# Patient Record
Sex: Female | Born: 1935 | Race: White | Hispanic: No | Marital: Single
Health system: Southern US, Community
[De-identification: ages and names within clinical notes are randomized; demographics above are authoritative.]

---

## 2005-10-08 ENCOUNTER — Emergency Department: Payer: Self-pay | Admitting: Emergency Medicine

## 2008-05-01 ENCOUNTER — Emergency Department: Payer: Self-pay | Admitting: Emergency Medicine

## 2009-05-27 ENCOUNTER — Inpatient Hospital Stay: Payer: Self-pay | Admitting: Podiatry

## 2009-06-10 ENCOUNTER — Emergency Department: Payer: Self-pay

## 2010-09-20 ENCOUNTER — Emergency Department: Payer: Self-pay | Admitting: Emergency Medicine

## 2012-03-09 ENCOUNTER — Emergency Department: Payer: Self-pay | Admitting: Emergency Medicine

## 2012-03-09 LAB — URINALYSIS, COMPLETE
Bilirubin,UR: NEGATIVE
Glucose,UR: NEGATIVE mg/dL (ref 0–75)
Hyaline Cast: 1
Ketone: NEGATIVE
Nitrite: NEGATIVE
Ph: 5 (ref 4.5–8.0)
WBC UR: 5 /HPF (ref 0–5)

## 2012-04-15 ENCOUNTER — Emergency Department: Payer: Self-pay | Admitting: Emergency Medicine

## 2012-04-15 LAB — COMPREHENSIVE METABOLIC PANEL
Albumin: 3.3 g/dL — ABNORMAL LOW (ref 3.4–5.0)
Alkaline Phosphatase: 147 U/L — ABNORMAL HIGH (ref 50–136)
BUN: 24 mg/dL — ABNORMAL HIGH (ref 7–18)
Bilirubin,Total: 0.3 mg/dL (ref 0.2–1.0)
Calcium, Total: 10.2 mg/dL — ABNORMAL HIGH (ref 8.5–10.1)
Chloride: 97 mmol/L — ABNORMAL LOW (ref 98–107)
Co2: 29 mmol/L (ref 21–32)
Creatinine: 1.29 mg/dL (ref 0.60–1.30)
Osmolality: 272 (ref 275–301)
Potassium: 4.3 mmol/L (ref 3.5–5.1)
SGOT(AST): 27 U/L (ref 15–37)
SGPT (ALT): 16 U/L
Sodium: 133 mmol/L — ABNORMAL LOW (ref 136–145)

## 2012-04-15 LAB — CBC WITH DIFFERENTIAL/PLATELET
Eosinophil %: 1.5 %
HCT: 32.3 % — ABNORMAL LOW (ref 35.0–47.0)
HGB: 10.7 g/dL — ABNORMAL LOW (ref 12.0–16.0)
Lymphocyte #: 1.1 10*3/uL (ref 1.0–3.6)
Lymphocyte %: 24.8 %
Monocyte #: 0.6 x10 3/mm (ref 0.2–0.9)
Neutrophil %: 58.4 %
RBC: 3.21 10*6/uL — ABNORMAL LOW (ref 3.80–5.20)
WBC: 4.5 10*3/uL (ref 3.6–11.0)

## 2012-04-15 LAB — URINALYSIS, COMPLETE
Bilirubin,UR: NEGATIVE
Blood: NEGATIVE
Glucose,UR: NEGATIVE mg/dL (ref 0–75)
Ketone: NEGATIVE
Nitrite: NEGATIVE
Ph: 6 (ref 4.5–8.0)
Specific Gravity: 1.01 (ref 1.003–1.030)

## 2012-04-17 LAB — URINE CULTURE

## 2012-04-18 ENCOUNTER — Emergency Department: Payer: Self-pay | Admitting: Emergency Medicine

## 2012-04-18 LAB — COMPREHENSIVE METABOLIC PANEL
Albumin: 3.5 g/dL (ref 3.4–5.0)
BUN: 19 mg/dL — ABNORMAL HIGH (ref 7–18)
Calcium, Total: 10.2 mg/dL — ABNORMAL HIGH (ref 8.5–10.1)
Co2: 31 mmol/L (ref 21–32)
Creatinine: 1.38 mg/dL — ABNORMAL HIGH (ref 0.60–1.30)
EGFR (Non-African Amer.): 34 — ABNORMAL LOW
Glucose: 125 mg/dL — ABNORMAL HIGH (ref 65–99)
Osmolality: 274 (ref 275–301)
Potassium: 3.5 mmol/L (ref 3.5–5.1)
SGOT(AST): 32 U/L (ref 15–37)

## 2012-04-18 LAB — CBC
HCT: 34.7 % — ABNORMAL LOW (ref 35.0–47.0)
HGB: 11.5 g/dL — ABNORMAL LOW (ref 12.0–16.0)
MCV: 102 fL — ABNORMAL HIGH (ref 80–100)
Platelet: 192 10*3/uL (ref 150–440)

## 2012-04-18 LAB — URINALYSIS, COMPLETE
Bacteria: NONE SEEN
Bilirubin,UR: NEGATIVE
Blood: NEGATIVE
Glucose,UR: NEGATIVE mg/dL (ref 0–75)
Ketone: NEGATIVE
Nitrite: NEGATIVE
Ph: 6 (ref 4.5–8.0)
Protein: NEGATIVE
RBC,UR: <1 /HPF (ref 0–5)
Specific Gravity: 1.013 (ref 1.003–1.030)
Squamous Epithelial: <1
WBC UR: 15 /HPF (ref 0–5)

## 2012-04-18 LAB — LIPASE, BLOOD: Lipase: 806 U/L — ABNORMAL HIGH (ref 73–393)

## 2012-05-31 ENCOUNTER — Emergency Department: Payer: Self-pay | Admitting: Emergency Medicine

## 2012-05-31 LAB — URINALYSIS, COMPLETE
Bacteria: NONE SEEN
Glucose,UR: NEGATIVE mg/dL (ref 0–75)
Hyaline Cast: 4
Nitrite: NEGATIVE
Ph: 5 (ref 4.5–8.0)
Specific Gravity: 1.014 (ref 1.003–1.030)

## 2012-05-31 LAB — TROPONIN I: Troponin-I: <0.02 ng/mL

## 2012-05-31 LAB — COMPREHENSIVE METABOLIC PANEL
Albumin: 3.1 g/dL — ABNORMAL LOW (ref 3.4–5.0)
Alkaline Phosphatase: 99 U/L (ref 50–136)
Anion Gap: 6 — ABNORMAL LOW (ref 7–16)
BUN: 22 mg/dL — ABNORMAL HIGH (ref 7–18)
Calcium, Total: 10.2 mg/dL — ABNORMAL HIGH (ref 8.5–10.1)
Co2: 30 mmol/L (ref 21–32)
EGFR (Non-African Amer.): 38 — ABNORMAL LOW
Glucose: 167 mg/dL — ABNORMAL HIGH (ref 65–99)
Potassium: 3.9 mmol/L (ref 3.5–5.1)
SGOT(AST): 28 U/L (ref 15–37)
Sodium: 136 mmol/L (ref 136–145)
Total Protein: 7.1 g/dL (ref 6.4–8.2)

## 2012-05-31 LAB — CBC
HGB: 10.8 g/dL — ABNORMAL LOW (ref 12.0–16.0)
MCH: 32.4 pg (ref 26.0–34.0)
Platelet: 206 10*3/uL (ref 150–440)
RDW: 12.6 % (ref 11.5–14.5)
WBC: 4.6 10*3/uL (ref 3.6–11.0)

## 2012-05-31 LAB — LIPASE, BLOOD: Lipase: 205 U/L (ref 73–393)

## 2012-11-10 ENCOUNTER — Ambulatory Visit: Payer: Self-pay | Admitting: Hospice and Palliative Medicine

## 2012-12-05 ENCOUNTER — Emergency Department: Payer: Self-pay | Admitting: Emergency Medicine

## 2012-12-05 LAB — URINALYSIS, COMPLETE
Bacteria: NONE SEEN
Bilirubin,UR: NEGATIVE
Glucose,UR: NEGATIVE mg/dL (ref 0–75)
Ketone: NEGATIVE
Nitrite: NEGATIVE
Ph: 5 (ref 4.5–8.0)
RBC,UR: 11 /HPF (ref 0–5)
Squamous Epithelial: 3
WBC UR: 24 /HPF (ref 0–5)

## 2012-12-05 LAB — COMPREHENSIVE METABOLIC PANEL
Alkaline Phosphatase: 120 U/L (ref 50–136)
Bilirubin,Total: 0.6 mg/dL (ref 0.2–1.0)
Chloride: 98 mmol/L (ref 98–107)
Co2: 29 mmol/L (ref 21–32)
Creatinine: 1.41 mg/dL — ABNORMAL HIGH (ref 0.60–1.30)
EGFR (African American): 38 — ABNORMAL LOW
EGFR (Non-African Amer.): 33 — ABNORMAL LOW
SGOT(AST): 38 U/L — ABNORMAL HIGH (ref 15–37)
SGPT (ALT): 16 U/L (ref 12–78)

## 2012-12-05 LAB — CBC WITH DIFFERENTIAL/PLATELET
Basophil %: 0.3 %
Eosinophil #: 0.2 10*3/uL (ref 0.0–0.7)
Eosinophil %: 3.2 %
HGB: 11.2 g/dL — ABNORMAL LOW (ref 12.0–16.0)
Lymphocyte #: 0.9 10*3/uL — ABNORMAL LOW (ref 1.0–3.6)
MCH: 33.6 pg (ref 26.0–34.0)
MCHC: 33 g/dL (ref 32.0–36.0)
MCV: 102 fL — ABNORMAL HIGH (ref 80–100)
Monocyte #: 0.7 x10 3/mm (ref 0.2–0.9)
Neutrophil #: 5.4 10*3/uL (ref 1.4–6.5)
Platelet: 193 10*3/uL (ref 150–440)
RBC: 3.35 10*6/uL — ABNORMAL LOW (ref 3.80–5.20)

## 2012-12-05 LAB — PROTIME-INR
INR: 0.8
Prothrombin Time: 11.2 secs — ABNORMAL LOW (ref 11.5–14.7)

## 2012-12-07 ENCOUNTER — Inpatient Hospital Stay: Payer: Self-pay | Admitting: Internal Medicine

## 2012-12-07 LAB — CBC WITH DIFFERENTIAL/PLATELET
Basophil #: 0 10*3/uL (ref 0.0–0.1)
Eosinophil %: 0.1 %
Lymphocyte #: 0.7 10*3/uL — ABNORMAL LOW (ref 1.0–3.6)
Lymphocyte %: 10.7 %
MCHC: 33.6 g/dL (ref 32.0–36.0)
MCV: 102 fL — ABNORMAL HIGH (ref 80–100)
Monocyte #: 0.7 x10 3/mm (ref 0.2–0.9)
Monocyte %: 10 %
Neutrophil #: 5.3 10*3/uL (ref 1.4–6.5)
Neutrophil %: 78.9 %
Platelet: 197 10*3/uL (ref 150–440)
RBC: 3.06 10*6/uL — ABNORMAL LOW (ref 3.80–5.20)
RDW: 14.3 % (ref 11.5–14.5)
WBC: 6.7 10*3/uL (ref 3.6–11.0)

## 2012-12-07 LAB — COMPREHENSIVE METABOLIC PANEL
Albumin: 2.5 g/dL — ABNORMAL LOW (ref 3.4–5.0)
Alkaline Phosphatase: 97 U/L (ref 50–136)
BUN: 28 mg/dL — ABNORMAL HIGH (ref 7–18)
Bilirubin,Total: 0.4 mg/dL (ref 0.2–1.0)
Co2: 25 mmol/L (ref 21–32)
Creatinine: 1.63 mg/dL — ABNORMAL HIGH (ref 0.60–1.30)
EGFR (Non-African Amer.): 28 — ABNORMAL LOW
Osmolality: 273 (ref 275–301)
SGPT (ALT): 29 U/L (ref 12–78)
Sodium: 134 mmol/L — ABNORMAL LOW (ref 136–145)
Total Protein: 6.8 g/dL (ref 6.4–8.2)

## 2012-12-07 LAB — APTT: Activated PTT: 31.9 secs (ref 23.6–35.9)

## 2012-12-07 LAB — URINALYSIS, COMPLETE
Bacteria: NONE SEEN
Bilirubin,UR: NEGATIVE
Glucose,UR: NEGATIVE mg/dL (ref 0–75)
Ph: 5 (ref 4.5–8.0)
Protein: NEGATIVE
RBC,UR: 22 /HPF (ref 0–5)
WBC UR: 88 /HPF (ref 0–5)

## 2012-12-07 LAB — PROTIME-INR
INR: 1
Prothrombin Time: 13.9 secs (ref 11.5–14.7)

## 2012-12-07 LAB — CK TOTAL AND CKMB (NOT AT ARMC): CK, Total: 437 U/L — ABNORMAL HIGH (ref 21–215)

## 2012-12-07 LAB — TROPONIN I: Troponin-I: 0.12 ng/mL — ABNORMAL HIGH

## 2012-12-08 LAB — CBC WITH DIFFERENTIAL/PLATELET
Basophil #: 0 10*3/uL (ref 0.0–0.1)
Basophil %: 0.1 %
Eosinophil #: 0 10*3/uL (ref 0.0–0.7)
Eosinophil %: 0.3 %
HCT: 25.3 % — ABNORMAL LOW (ref 35.0–47.0)
Lymphocyte #: 0.8 10*3/uL — ABNORMAL LOW (ref 1.0–3.6)
MCHC: 33.5 g/dL (ref 32.0–36.0)
MCV: 102 fL — ABNORMAL HIGH (ref 80–100)
Monocyte #: 0.6 x10 3/mm (ref 0.2–0.9)
Monocyte %: 10.4 %
Neutrophil #: 4.6 10*3/uL (ref 1.4–6.5)
Platelet: 168 10*3/uL (ref 150–440)
RBC: 2.49 10*6/uL — ABNORMAL LOW (ref 3.80–5.20)
WBC: 6.1 10*3/uL (ref 3.6–11.0)

## 2012-12-08 LAB — COMPREHENSIVE METABOLIC PANEL
Albumin: 2 g/dL — ABNORMAL LOW (ref 3.4–5.0)
Anion Gap: 11 (ref 7–16)
Bilirubin,Total: 0.2 mg/dL (ref 0.2–1.0)
Calcium, Total: 8.7 mg/dL (ref 8.5–10.1)
Chloride: 105 mmol/L (ref 98–107)
Glucose: 67 mg/dL (ref 65–99)
Osmolality: 285 (ref 275–301)
Potassium: 3.1 mmol/L — ABNORMAL LOW (ref 3.5–5.1)
Total Protein: 5.2 g/dL — ABNORMAL LOW (ref 6.4–8.2)

## 2012-12-08 LAB — TROPONIN I: Troponin-I: 0.15 ng/mL — ABNORMAL HIGH

## 2012-12-08 LAB — URINE CULTURE

## 2012-12-09 LAB — BASIC METABOLIC PANEL
Anion Gap: 8 (ref 7–16)
BUN: 17 mg/dL (ref 7–18)
Co2: 25 mmol/L (ref 21–32)
EGFR (African American): 44 — ABNORMAL LOW
EGFR (Non-African Amer.): 38 — ABNORMAL LOW
Glucose: 137 mg/dL — ABNORMAL HIGH (ref 65–99)
Potassium: 3.4 mmol/L — ABNORMAL LOW (ref 3.5–5.1)

## 2012-12-09 LAB — CBC WITH DIFFERENTIAL/PLATELET
Basophil #: 0 10*3/uL (ref 0.0–0.1)
HCT: 24.3 % — ABNORMAL LOW (ref 35.0–47.0)
Lymphocyte %: 18.5 %
Monocyte %: 11.9 %
Neutrophil #: 3.1 10*3/uL (ref 1.4–6.5)
Platelet: 158 10*3/uL (ref 150–440)
RDW: 14.3 % (ref 11.5–14.5)

## 2012-12-10 LAB — IRON AND TIBC
Iron Bind.Cap.(Total): 190 ug/dL — ABNORMAL LOW (ref 250–450)
Iron Saturation: 18 %
Iron: 34 ug/dL — ABNORMAL LOW (ref 50–170)

## 2012-12-10 LAB — OCCULT BLOOD X 1 CARD TO LAB, STOOL: Occult Blood, Feces: NEGATIVE

## 2012-12-10 LAB — CBC WITH DIFFERENTIAL/PLATELET
Basophil #: 0 10*3/uL (ref 0.0–0.1)
Basophil %: 0.2 %
HCT: 23.5 % — ABNORMAL LOW (ref 35.0–47.0)
HGB: 7.7 g/dL — ABNORMAL LOW (ref 12.0–16.0)
Lymphocyte %: 17.2 %
MCH: 33.3 pg (ref 26.0–34.0)
MCHC: 32.6 g/dL (ref 32.0–36.0)
Neutrophil %: 71 %
Platelet: 152 10*3/uL (ref 150–440)
RDW: 14 % (ref 11.5–14.5)

## 2012-12-10 LAB — BASIC METABOLIC PANEL
Anion Gap: 10 (ref 7–16)
Calcium, Total: 8.2 mg/dL — ABNORMAL LOW (ref 8.5–10.1)
Chloride: 112 mmol/L — ABNORMAL HIGH (ref 98–107)
Co2: 21 mmol/L (ref 21–32)
Creatinine: 1.22 mg/dL (ref 0.60–1.30)
EGFR (African American): 46 — ABNORMAL LOW
EGFR (Non-African Amer.): 40 — ABNORMAL LOW
Glucose: 164 mg/dL — ABNORMAL HIGH (ref 65–99)
Osmolality: 289 (ref 275–301)
Potassium: 3.6 mmol/L (ref 3.5–5.1)

## 2012-12-10 LAB — FOLATE: Folic Acid: 17.4 ng/mL (ref 3.1–100.0)

## 2012-12-11 ENCOUNTER — Ambulatory Visit: Payer: Self-pay | Admitting: Hospice and Palliative Medicine

## 2012-12-11 LAB — HEMOGLOBIN: HGB: 8.8 g/dL — ABNORMAL LOW (ref 12.0–16.0)

## 2012-12-12 LAB — COMPREHENSIVE METABOLIC PANEL
Alkaline Phosphatase: 68 U/L (ref 50–136)
Anion Gap: 10 (ref 7–16)
BUN: 12 mg/dL (ref 7–18)
Bilirubin,Total: 0.3 mg/dL (ref 0.2–1.0)
Calcium, Total: 8.4 mg/dL — ABNORMAL LOW (ref 8.5–10.1)
Co2: 28 mmol/L (ref 21–32)
Creatinine: 1.14 mg/dL (ref 0.60–1.30)
EGFR (African American): 50 — ABNORMAL LOW
Potassium: 2.8 mmol/L — ABNORMAL LOW (ref 3.5–5.1)
SGPT (ALT): 23 U/L (ref 12–78)
Sodium: 140 mmol/L (ref 136–145)
Total Protein: 5.8 g/dL — ABNORMAL LOW (ref 6.4–8.2)

## 2012-12-12 LAB — FERRITIN: Ferritin (ARMC): 78 ng/mL (ref 8–388)

## 2012-12-12 LAB — CULTURE, BLOOD (SINGLE)

## 2012-12-12 LAB — CBC WITH DIFFERENTIAL/PLATELET
Basophil %: 0.2 %
Eosinophil #: 0.1 10*3/uL (ref 0.0–0.7)
Eosinophil %: 1.8 %
HCT: 27.2 % — ABNORMAL LOW (ref 35.0–47.0)
HGB: 9.4 g/dL — ABNORMAL LOW (ref 12.0–16.0)
Lymphocyte #: 1.3 10*3/uL (ref 1.0–3.6)
Lymphocyte %: 20.2 %
MCV: 99 fL (ref 80–100)
Monocyte %: 11.5 %
Platelet: 195 10*3/uL (ref 150–440)
RBC: 2.75 10*6/uL — ABNORMAL LOW (ref 3.80–5.20)
RDW: 13.9 % (ref 11.5–14.5)

## 2012-12-13 LAB — ELECTROLYTE PANEL
Anion Gap: 8 (ref 7–16)
Chloride: 105 mmol/L (ref 98–107)
Co2: 28 mmol/L (ref 21–32)
Potassium: 3.1 mmol/L — ABNORMAL LOW (ref 3.5–5.1)
Sodium: 141 mmol/L (ref 136–145)

## 2013-01-11 ENCOUNTER — Ambulatory Visit: Payer: Self-pay | Admitting: Hospice and Palliative Medicine

## 2013-02-08 DEATH — deceased

## 2014-02-28 IMAGING — CT CT PELVIS W/O CM
1 series · 16 of 32 positions shown, 20 images · non-contrast
Comparison: none

REASON FOR EXAM: fall pain
COMMENTS:

[Series 2: bone windows · axial · 0.67mm/px · z∈[-614,-374]mm · 16 of 89 slices shown, 20 images]
[im 6/89  soft-tissue]
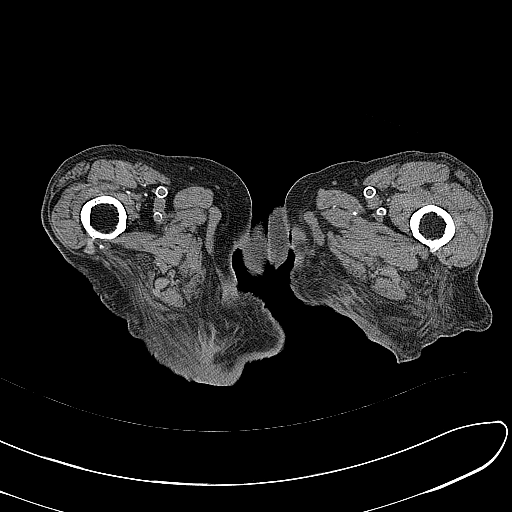
[im 6/89  bone]
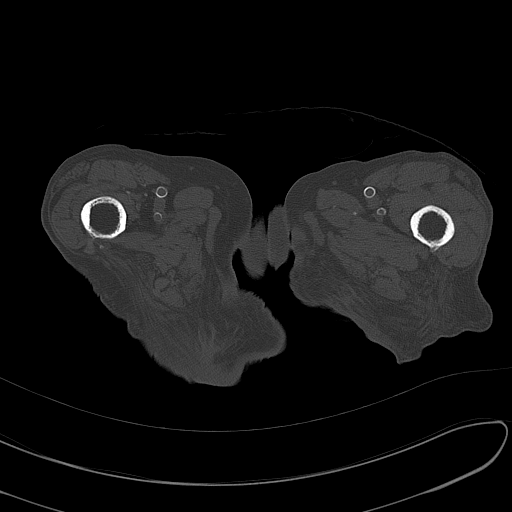
[im 12/89  soft-tissue]
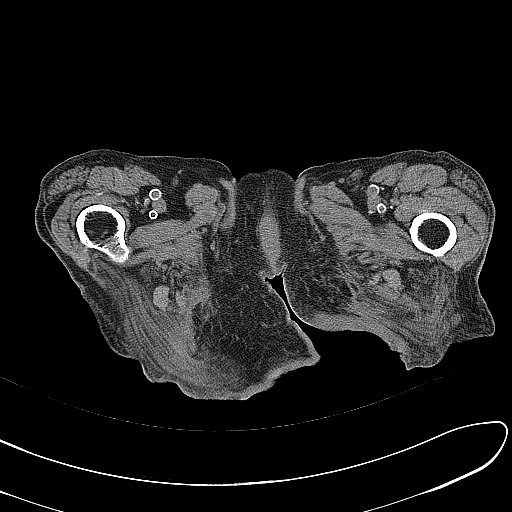
[im 18/89  soft-tissue]
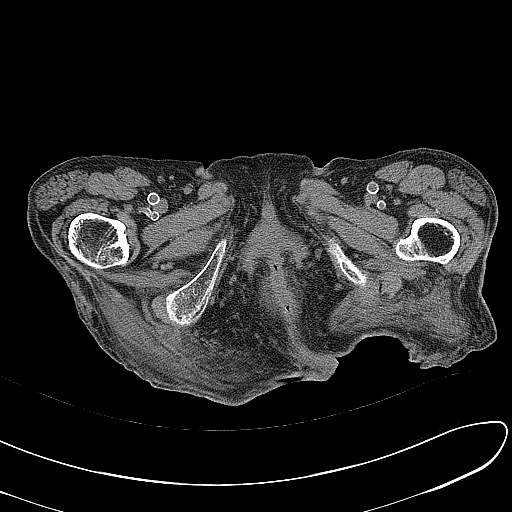
[im 23/89  soft-tissue]
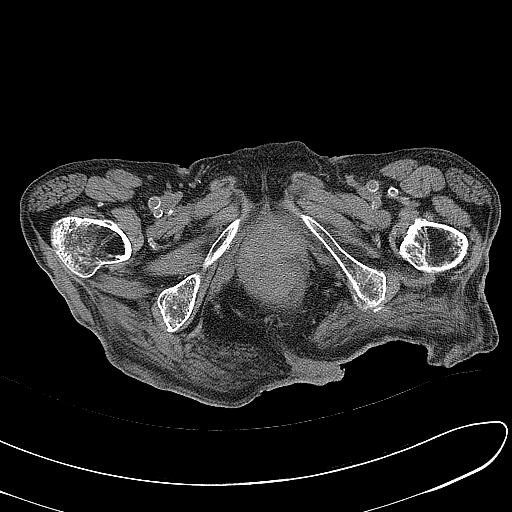
[im 29/89  soft-tissue]
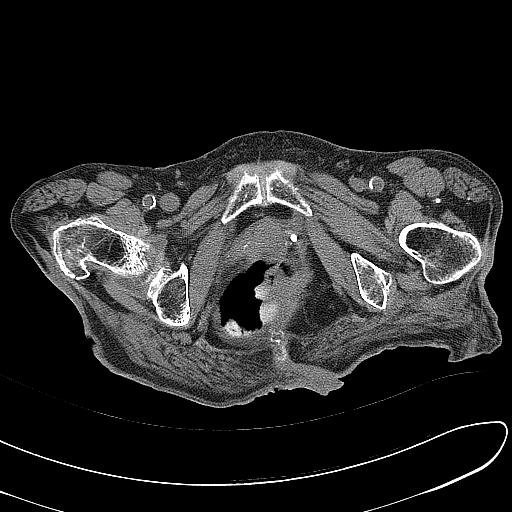
[im 35/89  soft-tissue]
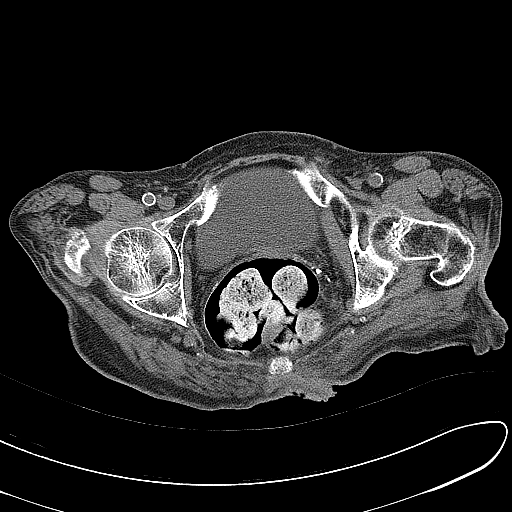
[im 40/89  soft-tissue]
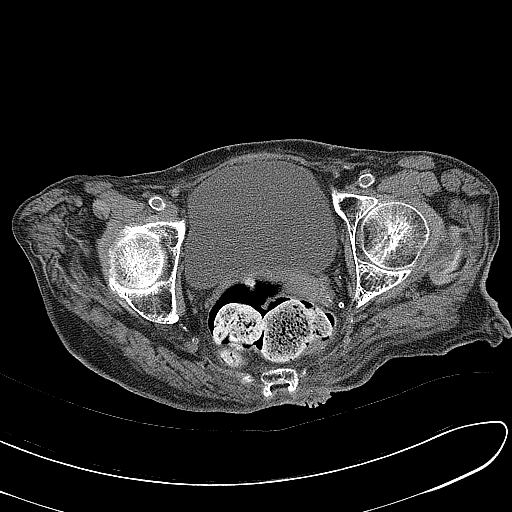
[im 49/89  soft-tissue]
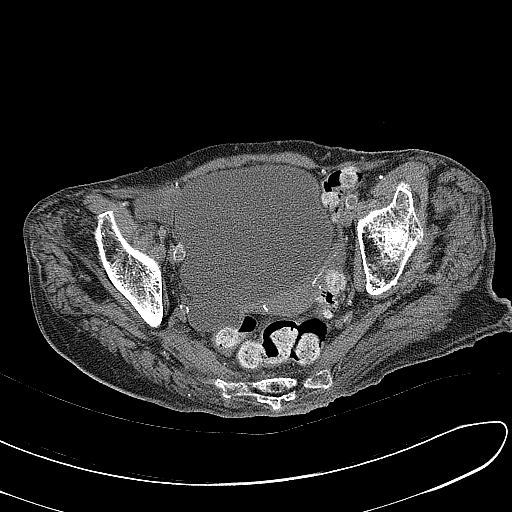
[im 54/89  soft-tissue]
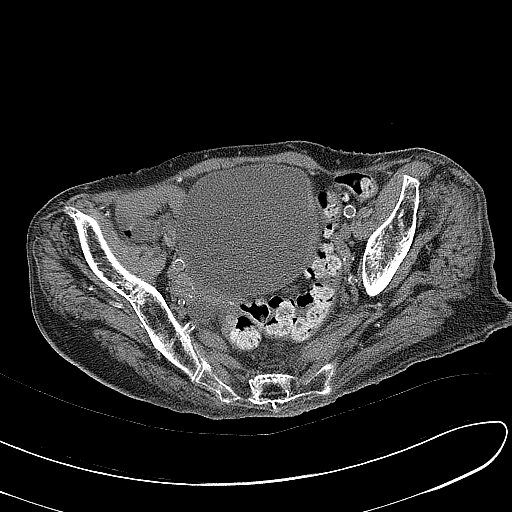
[im 54/89  bone]
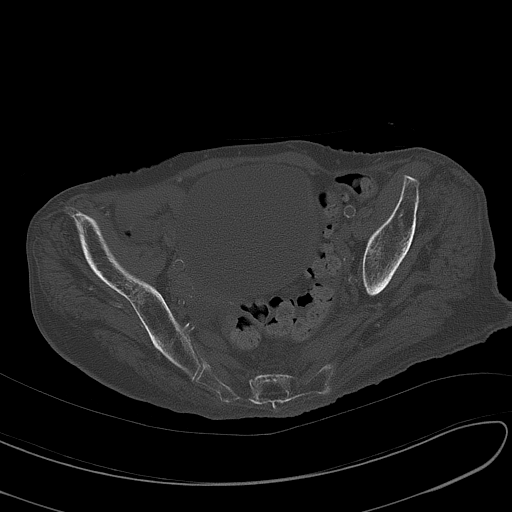
[im 60/89  soft-tissue]
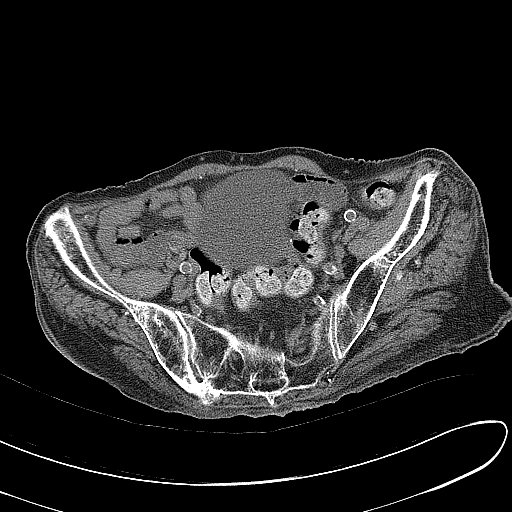
[im 66/89  soft-tissue]
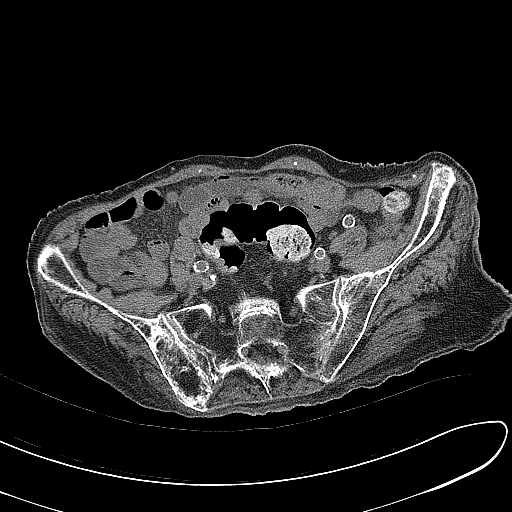
[im 71/89  soft-tissue]
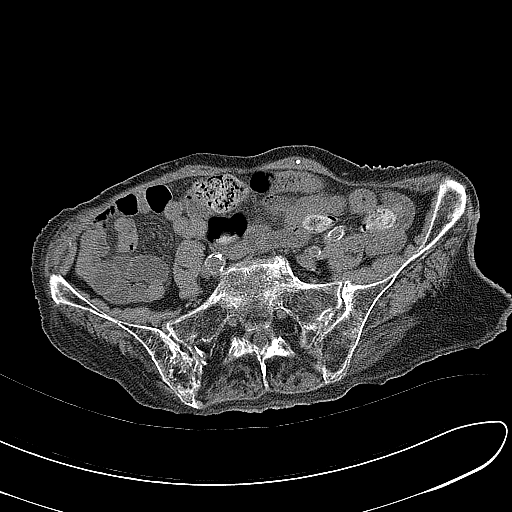
[im 77/89  soft-tissue]
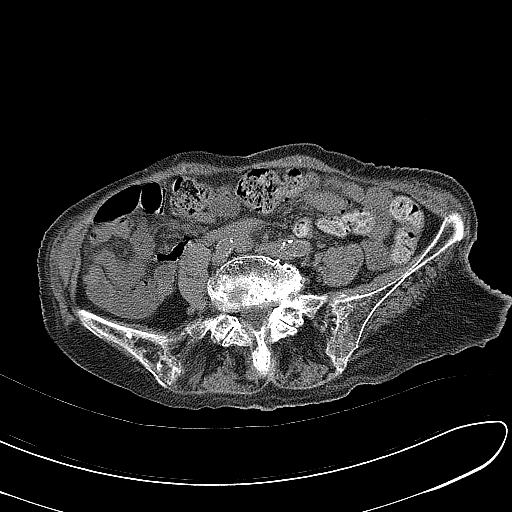
[im 77/89  lung]
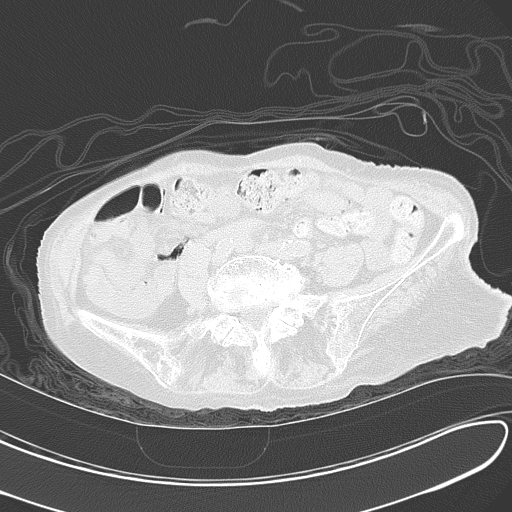
[im 80/89  lung]
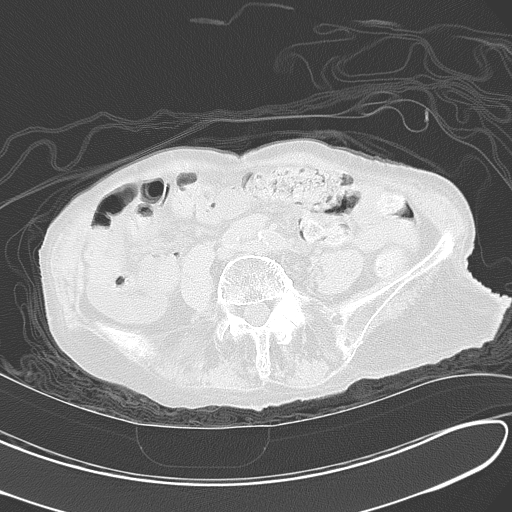
[im 83/89  soft-tissue]
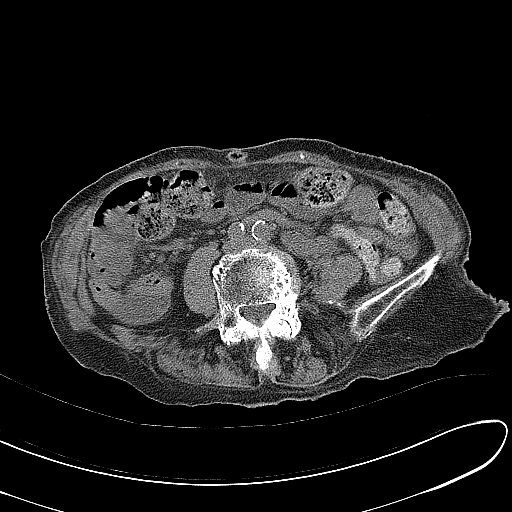
[im 83/89  lung]
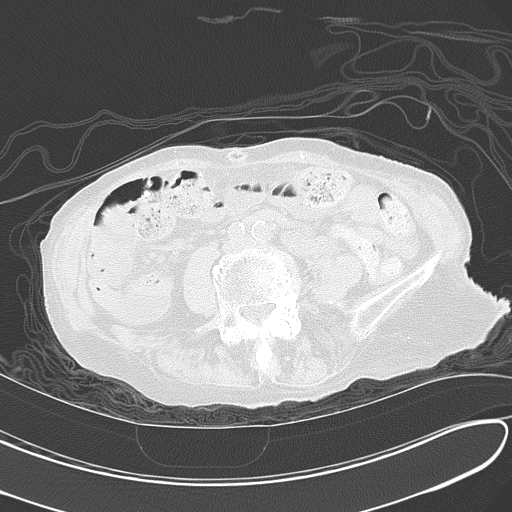
[im 86/89  lung]
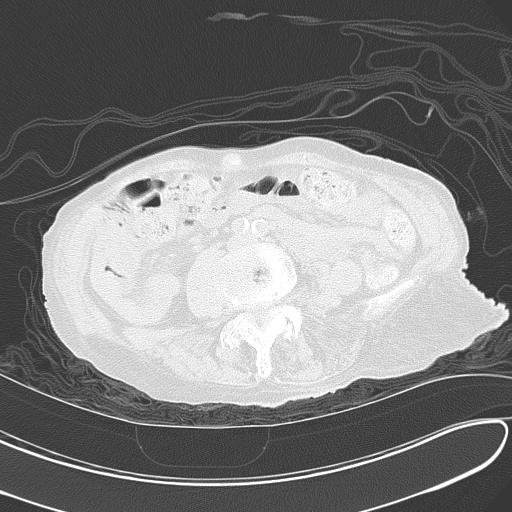

[16 of 32 positions shown; findings below may reference images not displayed]

PROCEDURE:     CT  - CT PELVIS STANDARD WO  - March 09, 2012  [DATE]

RESULT:     Multislice helical acquisition through the pelvis is
reconstructed at 3.0 mm slice thickness in the axial, coronal and sagittal
planes at high-resolution bone algorithm. The patient has no previous exam
for comparison.

Prominent atherosclerotic calcification is present. There appears to be a
large amount of urine in the urinary bladder. Degenerative changes are noted
in the sacroiliac joints and in the lumbosacral junction especially in the
facets. No femoral fracture is evident. The pubic symphysis is not widened.
There is no pubic fracture demonstrated. The acetabular components appear
normal.
IMPRESSION: 1. No acute bony abnormality of the pelvis evident. Prominent
atherosclerotic calcification. Moderately large to large amount of urine in
the urinary bladder possibly with a posterior bladder diverticulum on the
right. Degenerative changes are noted in the sacroiliac joints, hips and
lumbosacral region.

## 2014-02-28 IMAGING — CT CT MAXILLOFACIAL WITHOUT CONTRAST
1 of 2 series · 13 of 30 positions shown, 17 images · non-contrast
Comparison: none

REASON FOR EXAM: fall injury chin
COMMENTS:

[Series 4: facial 3.0 h60f · axial · 0.34mm/px · z∈[-162,-18]mm · 13 of 57 slices shown, 17 images]
[im 5/57  brain]
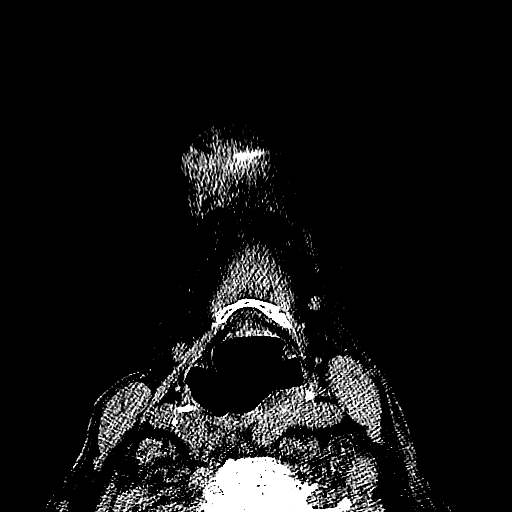
[im 5/57  bone]
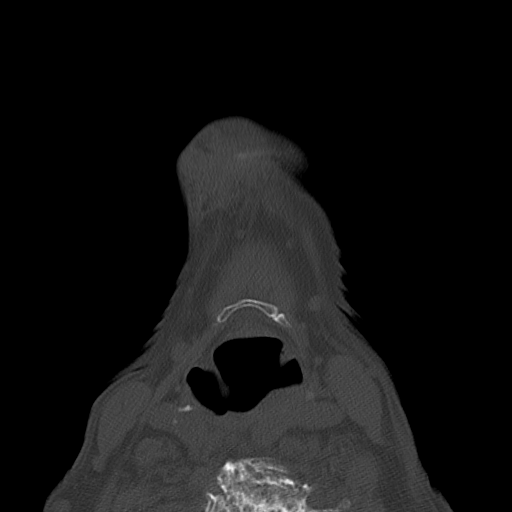
[im 9/57  bone]
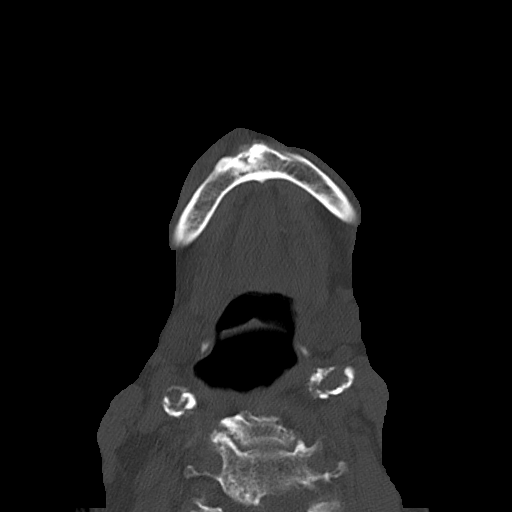
[im 13/57  bone]
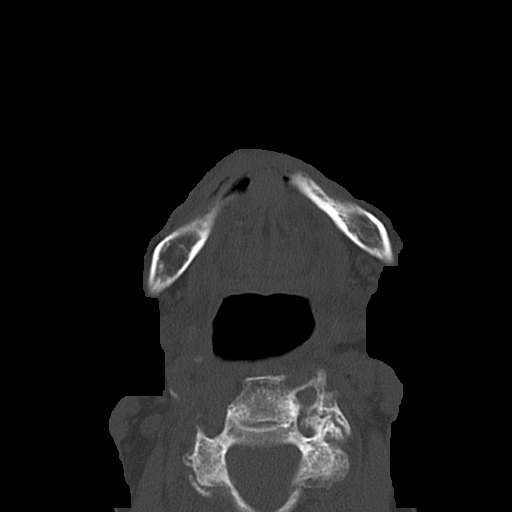
[im 17/57  bone]
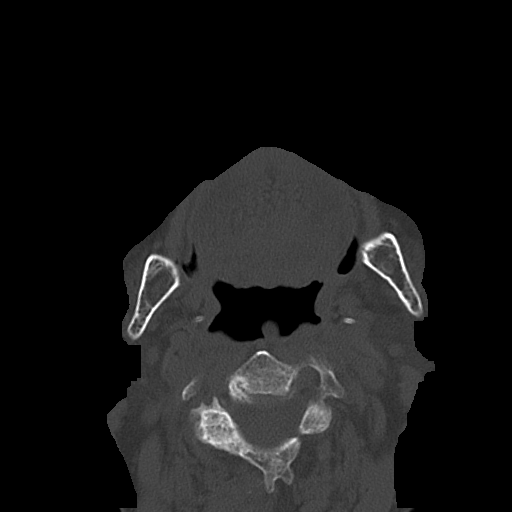
[im 21/57  brain]
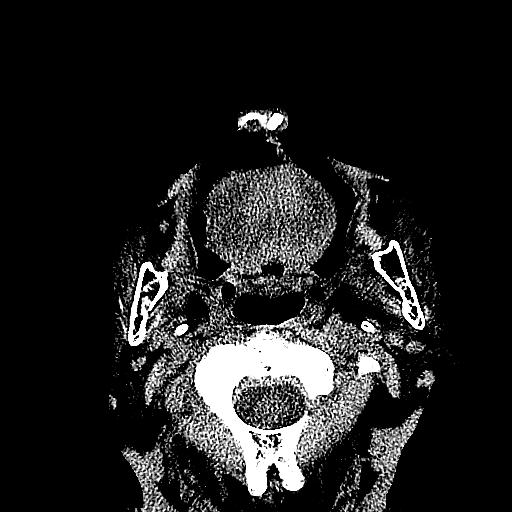
[im 21/57  bone]
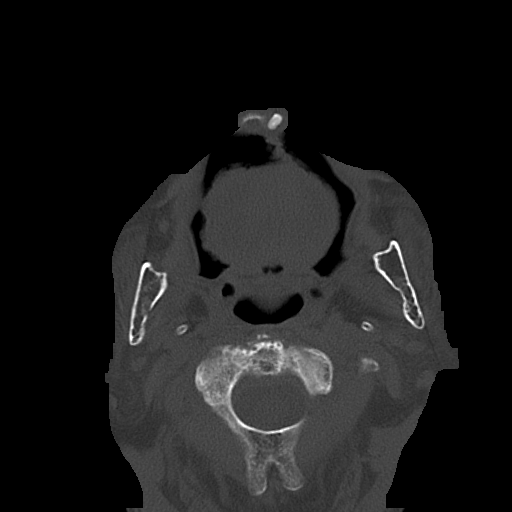
[im 25/57  bone]
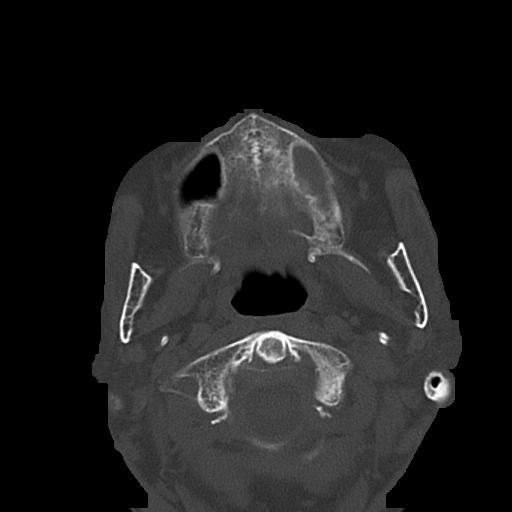
[im 29/57  bone]
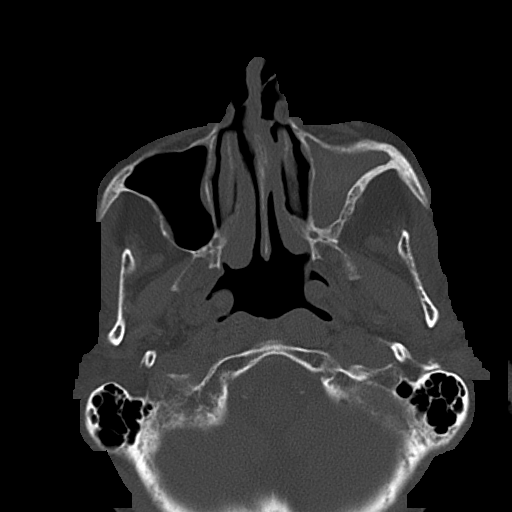
[im 33/57  bone]
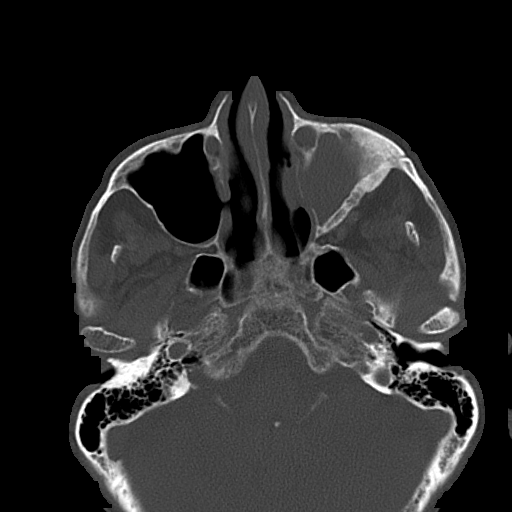
[im 37/57  brain]
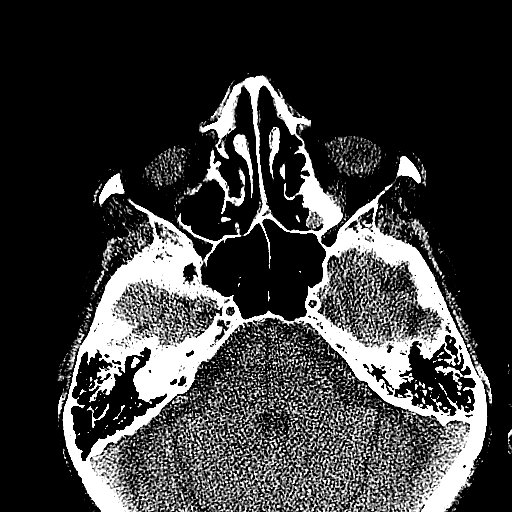
[im 37/57  bone]
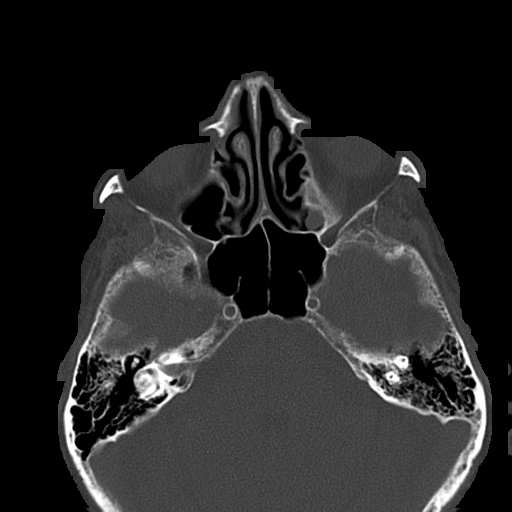
[im 41/57  bone]
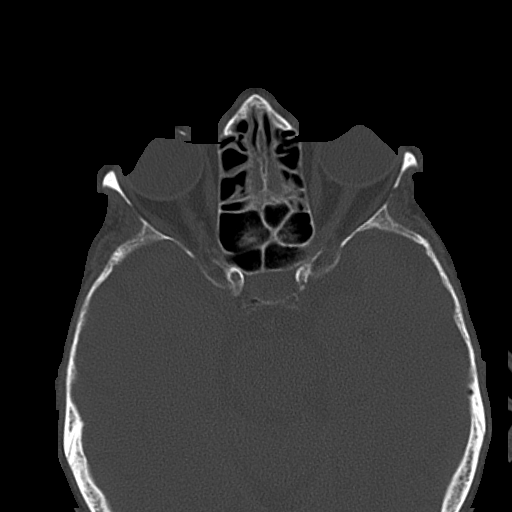
[im 45/57  bone]
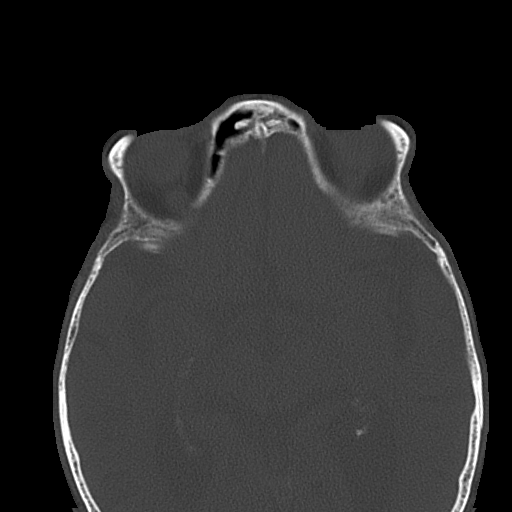
[im 49/57  bone]
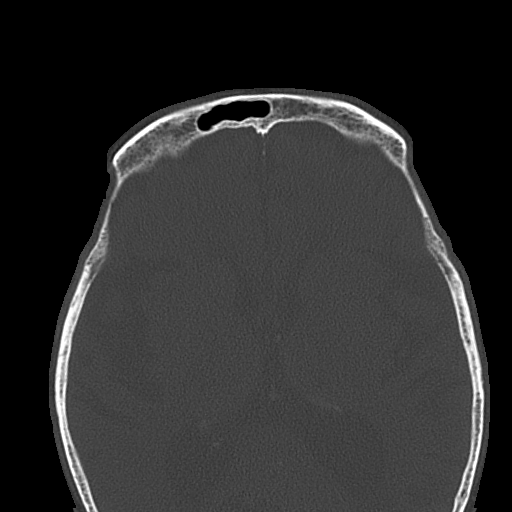
[im 53/57  brain]
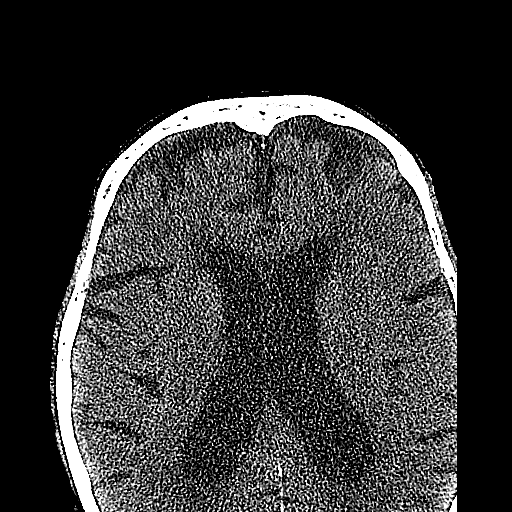
[im 53/57  bone]
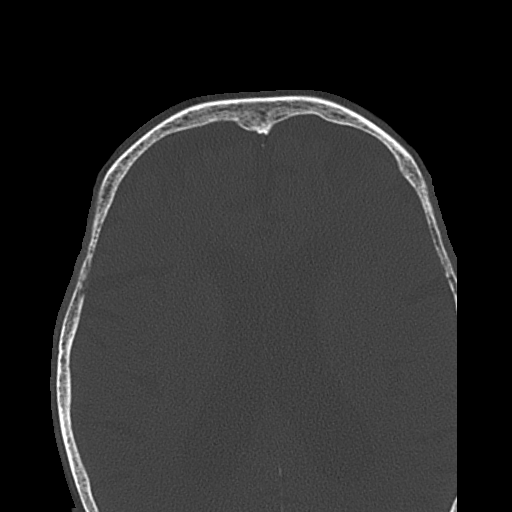

[13 of 30 positions shown; findings below may reference images not displayed]

PROCEDURE:     CT  - CT MAXILLOFACIAL AREA WO  - March 09, 2012  [DATE]

RESULT:     Emergent multislice helical acquisition through the
maxillofacial structures is reconstructed at high-resolution bone algorithm
at 3.0 mm slice thickness in the axial and coronal planes. The patient has
no previous exam for comparison.

There is anterior right nasal bone fracture of indeterminate age. Near
complete opacification of the left maxillary sinus is present suggestive of
chronic maxillary sinus disease given the bony thickening in the surrounding
maxilla. Nondisplaced possibly old left nasal bone fracture is present.
There does not appear to be significant overlying edema. The nasal septum is
intact and approximates midline. The globes are intact. The orbits appear
unremarkable. The zygomatic arches appear unremarkable. Mucosal thickening
is seen in the ethmoid sinuses. The left frontal sinus is hypoplastic. The
right frontal sinus shows normal aeration. The mandible appears intact. The
sphenoid is unremarkable. The zygomatic arches appear intact.
IMPRESSION: 1. Chronic left maxillary sinus disease. There is essentially complete
opacification.
2. Nasal bone fractures of indeterminate age. Given the lack of significant
overlying soft tissue swelling, these may be chronic. No significant
displacement is seen. The septum appears intact.

## 2015-03-30 NOTE — H&P (Signed)
PATIENT NAME:  Veronica Washington, Veronica Washington MR#:  [id number] DATE OF BIRTH:  October 07, 1922  DATE OF ADMISSION:  12/07/2012  REFERRING PHYSICIAN:  Jene Every, MD  FAMILY PHYSICIAN:  Yates Decamp, III, MD  REASON FOR ADMISSION: Weakness with dehydration, elevated troponin and presumed pneumonia.   HISTORY OF PRESENT ILLNESS: The patient is an 81 year old female who resides at an assisted living facility with a history of chronic anemia, benign hypertension, renal insufficiency and hyperlipidemia. Has osteoporosis.  Was in the Emergency Room recently with weakness. She represented today with shortness of breath. In the Emergency Room, the patient was afebrile and not hypoxic. However, she was found to have infiltrate on chest x-ray, and presumed UTI with dehydration and profound weakness. Her troponin was also noted to be elevated. She is now admitted for further evaluation. The patient complains of some shortness of breath, but denies chest pain or abdominal pain. No nausea, vomiting.   PAST MEDICAL/SURGICAL HISTORY: 1.  Chronic anemia.  2.  Benign hypertension.  3.  Hyperlipidemia.  4.  Onychomycosis.  5.  History of left shoulder dislocation.  6.  Osteoporosis.  7.  Chronic renal insufficiency.  8.  Hypercalcemia.  9.  Status post cholecystectomy.   MEDICATIONS: 1.  Amitriptyline 25 mg p.o. at bedtime.  2.  Colace 100 mg p.o. b.i.d.  3.  Diovan HCT 80/12.5, 1 p.o. daily.  4.  Plavix 75 mg p.o. daily.  5. Aspirin 81 mg p.o. daily.  6.  Prilosec 20 mg p.o. daily.  7.  Remeron 15 mg p.o. at bedtime.  8.  Vicodin 5/500, 1 p.o. q.4-6 hours p.r.n. pain.  9.  Lasix 20 mg p.o. daily as needed for swelling.   ALLERGIES: No known drug allergies.   SOCIAL HISTORY: Negative for alcohol or tobacco abuse.   FAMILY HISTORY: Unknown. Father died at age 11, but the cause was not known. Mother died of old age at age 11.   REVIEW OF SYSTEMS:  CONSTITUTIONAL: No fever or change in weight.  EYES: No blurred  or double vision. No glaucoma.  ENT: No tinnitus or hearing loss. No nasal discharge or bleeding. No difficulty swallowing.  RESPIRATORY: The patient has occasional cough. No wheezing or hemoptysis. No painful respiration.  CARDIOVASCULAR: No chest pain or orthopnea. No palpitations.  GASTROINTESTINAL: No nausea, vomiting or diarrhea. No abdominal pain. No change in bowel habits.  GENITOURINARY: No dysuria or hematuria. No incontinence.  ENDOCRINE: No polyuria or polydipsia. No heat or cold intolerance.  HEMATOLOGIC: The patient denies anemia. Denies easy bruising or bleeding.  LYMPHATIC: No swollen glands.  MUSCULOSKELETAL: The patient does have pain in her left shoulder and does have pain in her neck.  Denies back, knee or hip pain. No gout.  NEUROLOGIC: No numbness or migraines. Denies stroke or seizures.  PSYCHIATRIC: The patient denies anxiety, insomnia or depression.   PHYSICAL EXAMINATION: GENERAL: The patient is chronically ill-appearing but in no acute distress.  VITAL SIGNS: Vital signs remarkable for a blood pressure of 139/86 with a heart rate of 90 and a respiratory rate of 16. She is afebrile.  HEENT: Normocephalic, atraumatic. Pupils equally round and reactive to light and accommodation. Extraocular movements are intact. Sclerae are anicteric. Conjunctivae are clear.  Oropharynx is dry but clear.  NECK: Supple without JVD. No bruits. No adenopathy or thyromegaly is noted.  LUNGS: Scattered rhonchi at the bases. No wheezes or rales. No dullness.  CARDIAC: Regular rate and rhythm. Normal S1, S2. No significant rubs, murmurs or  gallops. PMI is nondisplaced. Chest wall is nontender.  ABDOMEN: Soft, nontender with normoactive bowel sounds. No organomegaly or masses were appreciated. No hernias or bruits were noted.  EXTREMITIES: Without clubbing, cyanosis or edema. Pulses were 2+ bilaterally.  SKIN: Warm and dry without rash or lesions.  NEUROLOGIC: Cranial nerves II through XII  grossly intact. Deep tendon reflexes were symmetric. Motor and sensory examination is nonfocal.  PSYCHIATRIC: The patient was alert and oriented to person, place and time. She was cooperative and used good judgment.   LABORATORY AND DIAGNOSTIC DATA:  EKG revealed sinus rhythm with PACs with no acute ischemic changes. Chest x-ray revealed a right infiltrate consistent with pneumonia. Urinalysis revealed trace leukocyte esterase with 88 WBCs per high-power field. Her white count was 6.7 with a hemoglobin of 10.4. Her glucose was 91 with a BUN of 28, creatinine 1.63 with a sodium of 134 and a GFR of 28. Troponin was elevated at 0.12.   ASSESSMENT: 1.  Presumed pneumonia.  2.  Urinary tract infection.  3.  Dehydration.  4.  Elevated troponin.  5.  Stage III chronic kidney disease.  6.  Hyponatremia.  7.  Chronic anemia.   PLAN: The patient will be admitted to the floor with telemetry. We will follow serial cardiac enzymes and continue Plavix and add Lovenox. We will obtain an echocardiogram and consult cardiology in regards to her elevated troponin. We will send off blood and urine cultures at this time. We will begin IV antibiotics for her UTI and presumed pneumonia. Follow-up chest x-ray in the morning. We will begin SCNs. We will begin empiric oxygen and wean as tolerated. Begin IV fluids. Follow up routine labs in the morning. We will consult physical therapy and the case manager for possible placement.  Will obtain a speech therapy consult because of possible aspiration and dysphagia. Further treatment and evaluation will depend upon the patient's progress.   Total time spent on this patient was 50 minutes.    ____________________________ Duane LopeJeffrey D. Judithann SheenSparks, MD jds:ct D: 12/07/2012 10:25:00 ET T: 12/08/2012 09:55:23 ET JOB#: [id number]  cc: Letta PateJohn B. Danne HarborWalker III, MD Duane LopeJeffrey D. Judithann SheenSparks, MD, <Dictator> JEFFREY Rodena Medin SPARKS MD ELECTRONICALLY SIGNED 12/08/2012 13:17

## 2015-04-02 NOTE — Discharge Summary (Signed)
PATIENT NAME:  Veronica LeatherwoodCHATMAN, Shyasia M MR#:  [id number] DATE OF BIRTH:  14-Jul-1922  DATE OF ADMISSION:  12/07/2012 DATE OF DISCHARGE:  12/13/2012  HISTORY OF PRESENT ILLNESS: Ms. Veronica Washington is an 81 year old white lady who was sent in from [facility] assisted living because of generalized weakness and probable pneumonia. The patient had recently been in the Emergency Room where she was found to have a chest x-ray that showed a possible infiltrate. She also was found to have urinary sediment. She was also found to be dehydrated. The patient was seen by [facility] and was admitted. Complicating the history was the fact that she had a minimally elevated troponin.   PAST MEDICAL HISTORY: 1. Hypertension.  2. Hyperlipidemia.  3. Osteoporosis.  4. Chronic renal insufficiency.  5. History of hypercalcemia. 6. History of chronic anemia.   ADMISSION MEDICATIONS:  1. Amitriptyline 25 mg at bedtime. 2. Colace 100 mg 2 times a day. 3. Diovan/HCT 80/12.5 mg daily.  4. Plavix 75 mg daily.  5. Aspirin 81 mg daily.  6. Prilosec 20 mg daily.  7. Remeron 15 mg at bedtime.  8. Lasix 20 mg daily p.r.n. edema. 9. Vicodin 5/500 mg 1 tablet every 4 to 6 hours as needed for pain.   ALLERGIES: No known drug allergies.   ADMISSION PHYSICAL EXAMINATION: The patient's admission vital signs showed a blood pressure of 139/86 with a pulse of 90. Respiratory rate was 16. The patient was afebrile. Examination as described by the admitting physician was most notable for scattered rhonchi at the lung bases. According to the admitting physician, she was alert and oriented x 3 and was cooperative and used good judgment. This is in contrast for her history at the facility and her hospital course after admission.  LABS/RADIOLOGIC STUDIES: The patient's admission CBC showed a hemoglobin of 10.4 with a hematocrit of 31. White count was 6700. Platelet count was 197,000. Admission comprehensive metabolic panel showed a BUN of 28 with a  creatinine 1.63. Estimated GFR was 28. Electrolytes were notable only for a sodium of 134. SGOT was 87. Albumin was 2.5. Troponin on admission was 0.12. CK was 437 with a CPK-MB of 8.3.   Admission urinalysis showed 22 RBCs per high-power field and 88 WBCs per high-power field. There were no bacteria noted, however.   Subsequent urine culture showed no growth. Blood cultures drawn on admission also eventually showed no growth.   EKG showed a sinus rhythm with PACs.   Echocardiogram showed left ventricular systolic function to be normal at 55%. There was moderate dilatation of the left atrium. The right atrium was also moderately dilated. There was moderate to severe mitral regurgitation. There was moderate to severe tricuspid regurgitation.   Admission chest x-ray showed a mild infiltrate of the right lung base.   HOSPITAL COURSE: The patient was admitted to the regular floor where cultures were obtained, as noted above. She was started on IV antibiotics pending culture report. The patient was also rehydrated with IV fluids. With rehydration the patient's renal function improved. She did however develop transient hypokalemia. The patient's blood pressure medications were basically held at the time of admission, but valsartan was eventually started back. She was placed temporarily on potassium supplements. The patient also had a transient drop in her hemoglobin while she was in the hospital. Hemoccults were negative. Iron was low. She was placed on iron and her hemoglobin was improving at the time of transfer. The patient remained intermittently confused throughout her hospital stay. She  did have periods of agitation and was seen in consultation by behavioral medicine. No changes however were recommended in her medications. It was also noted that the patient had paroxysmal atrial fibrillation, but that was on one day only. She did have rather frequent PACs on telemetry. At the time of transfer, the  patient is afebrile. Her chest x-ray showed clearing of the infiltrate. She did have some transient volume overload that was treated with IV Lasix. The patient's final hemoglobin on the day of transfer was 10.4. Her final basic metabolic panel on the day of transfer was notable only for a potassium of 3.1.   DISCHARGE DIAGNOSES: 1. Metabolic encephalopathy - multifactorial.  2. Right middle lobe infiltrate.  3. Hypertension.  4. Hyperlipidemia.  5. History of chronic anemia.  6. History of chronic renal insufficiency.  7. History of cerebrovascular disease.   DISCHARGE MEDICATIONS: 1. Tylenol 650 mg every 4 hours as needed.  2. Colace 100 mg 2 times a day. 3. Diovan 80 mg daily.  4. Plavix 75 mg daily.  5. Remeron 15 mg at bedtime.  6. Metoprolol 25 mg 2 times a day. 7. Protonix 40 mg 2 times a day. 8. Lorazepam 0.5 mg q. 8 hours p.r.n. anxiety.  9. Slow iron 1 tablet daily.  10. K-Dur 20 milliequivalents daily for an additional 3 days.  11. Haldol 5 mg IM q. 4 to 6 hours p.r.n. severe agitation.   DISCHARGE DISPOSITION: The patient is being discharged to a rehab facility for further reconditioning. It is undetermined at this time whether this will be a short or long-term stay. The patient was seen during her hospitalization by palliative care. She was made a NO CODE, NURSE MAY PRONOUNCE.  ____________________________ Letta Pate. Danne Harbor, MD jbw:sb D: 12/13/2012 12:54:20 ET T: 12/13/2012 13:33:25 ET JOB#: [id number]  cc: Jonny Ruiz B. Danne Harbor, MD, <Dictator> Elmo Putt III MD ELECTRONICALLY SIGNED 12/15/2012 16:09

## 2015-04-02 NOTE — Consult Note (Signed)
Chief Complaint:   Chief Complaint Admitted due to shortness of breath.   Presenting Symptoms:   Presenting Symptoms Impaired Memory  Impaired Concentration  Delusions   History of Present Illness:   History of Present Illness The patient is an 81 year old female who is pleasantly confused and a resident of  an assisted living facility admitted due to shortness of breath. In the Emergency Room, the patient was afebrile and not hypoxic. However, she was found to have infiltrate on chest x-ray, and presumed UTI with dehydration and profound weakness. Her troponin was also noted to be elevated. She was admitted for further evaluation and stabilization. Her potassium level is very low this morning.  During my interview, pt remains pleasantly confused. She was trying to cover herself with the sheets but does not know the name. she does not know that she was in the hospital. She told me that she had 13 siblings and all of them passed away and she is the only survivor. She lives by herself. Then she asked me if she left anything on the stove. I asked what she has been cooking. She said, "I don't cook anything..I found you, and you will cook for me". 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 However, she was unable to quantify. She stated that she was married for 20 plus years and does not have any children. She denied any mood, anxiety symptoms. No suicidal homicidal ideation or plans noted.   Target Symptoms:   Depressive Sleep Change  No aggression noted    Psychosis Delusions    Arousal/Cognitive Altered Consciousness  Inattention  Decreased Concentration  Disorientation    Capacity Unable to care for self independently   Substance Abuse- Alcohol: No substance use history known.  Substance Abuse- Tobacco Use: Tobacco Use: No.  PAST MEDICAL & SURGICAL HX:  Significant Events:   pressure ulcers both feet  - recurrent:     TIA - Transient Ischemic Attack: Oct 2002   Chronic Renal Insuff.:    Left Shouldar Dislocation:    Osteoporosis:    Degenerative Joint Disease:    Osteomylitis left 5th toe:    GERD - Esophageal Reflux:    Hypercholesterolemia:    HTN:    Cholecystectomy: 1997   amputation of Left 5th toe: 27-May-2009  CURRENT OUTPATIENT MEDICATIONS:  Home Medications: Medication Instructions Status  Levaquin 500 mg oral tablet 1 tab(s) orally every 24 hours Active  amitriptyline 25 mg oral tablet 1 tab(s) orally once a day at bedtime at 8pm for mood disorder/sleep Active  aspirin 81 mg oral delayed release tablet 1 tab(s) orally once a day for antiplatelet aggregation (8 am) Active  omeprazole 20 mg oral delayed release capsule 1 cap(s) orally once a day (GERD) (7 am) Active  clopidogrel 75 mg oral tablet 1 tab(s) orally once a day (anti-platelet aggregation) (8 am) Active  hydrochlorothiazide-valsartan 12.5 mg-80 mg oral tablet 1 tab(s) orally once a day (high blood pressure) (8 am) Active  acetaminophen 325 mg oral tablet 2 tabs (650mg ) orally once a day (in the morning) at 5am for pain. Active  polyethylene glycol 3350 oral powder for reconstitution Mix 1 capful (17 grams) with water, milk or juice and drink orally once a day. Active  Calcium 600+D 1 tab(s) orally 2 times a day at 8am and 5pm for osteoporosis. Active  mirtazapine 15 mg oral tablet 1 tab(s) orally 2 times a day (8am,  8pm). Active  acetaminophen-hydrocodone 500 mg-5 mg oral tablet 1 tab(s) orally every 4 hours while awake for pain. Active  mupirocin 2% topical ointment Apply topically to left foot as directed and lightly dress for infection. Active  furosemide 20 mg oral tablet 1 tab(s) orally once a day as needed for edema. Active  Pepto-Bismol 1 tablespoonsful (15 milliliters) orally every 2 hours as needed up to 6 doses in 24 hours. *standing order* Active  docusate sodium 100 mg oral tablet 1 tab(s) orally 2 times a day  for stool softener. Active   Family History: The patient denies any history of mental illness in the family..  Social History: Lives alone at Assisted living facility.  Mental Status Exam:   Mental Status Exam Thinly built pleasently confused female who appeared her stated age .    Speech Slowed    Mood Depressed    Affect Blunted    Thought Processes Loose associations    Thought Content Delusions    Orientation Not oriented to time, place or person    Concentration Poor    Memory Impaired    Fund of Knowledge Poor    Language Fair    Judgement Fair    Insight Fair    Reliabiity Fair   Suicide Risk Assessment: Suicide Risk Level No risk inidicated.  Review of Systems:  Review of Systems:   ROS Pt not able to provide ROS    Medications/Allergies Reviewed Medications/Allergies reviewed   NURSING FLOWSHEETS:  Vital Signs/Nurse Notes-CM: Transfer Form (Outpatient in Facility):   02-Jan-14 07:30   Telemetry pattern Cardiac Rhythm Atrial fibrillation  **Vital Signs.:   02-Jan-14 00:59   Pulse Ox % Pulse Ox % 93   Pulse Ox Activity Level  At rest   Oxygen Delivery Room Air/ 21 %    04:11   Vital Signs Type Routine   Temperature Temperature (F) 97.5   Celsius 36.3   Temperature Source Oral   Pulse Pulse 75   Respirations Respirations 16   Systolic BP Systolic BP 147   Diastolic BP (mmHg) Diastolic BP (mmHg) 99   Mean BP 115   Pulse Ox % Pulse Ox % 95   Pulse Ox Activity Level  At rest   Oxygen Delivery Room Air/ 21 %    07:27   Vital Signs Type Routine   Temperature Temperature (F) 98   Celsius 36.6   Temperature Source Oral   Pulse Pulse 97   Respirations Respirations 18   Systolic BP Systolic BP 140   Diastolic BP (mmHg) Diastolic BP (mmHg) 85   Mean BP 103   Pulse Ox % Pulse Ox % 96   Pulse Ox Activity Level  At rest   Oxygen Delivery Room Air/ 21 %    11:44   Vital Signs Type Routine   Temperature Temperature (F) 97.7   Celsius 36.5    Temperature Source oral   Pulse Pulse 76   Respirations Respirations 18   Systolic BP Systolic BP 146   Diastolic BP (mmHg) Diastolic BP (mmHg) 83   Mean BP 104   Pulse Ox % Pulse Ox % 95   Pulse Ox Activity Level  At rest   Oxygen Delivery Room Air/ 21 %  *Intake and Output.:   02-Jan-14 04:10   Grand Totals Intake:   Output:  125    Net:  -125 24 Hr.:  -1135   Urine ml     Out:  125   Urinary Method  Foley    06:41   [employer] Intake:   Output:      Net:   24 Hr.:  -1135   Weight Type daily   Weight Method Bed   Current Weight (lbs) (lbs) 104.2   Current Weight (kg) (kg) 47.2   Height (ft) (feet) 5   Height (in) (in) 5   Height (cm) centimeters 165.1   BSA (m2) 1.4   BMI (kg/m2) 17.3    Shift 07:00   Grand Totals Intake:   Output:  125    Net:  -125 24 Hr.:  -1135   Urine ml     Out:  125   Length of Stay Totals Intake:  3882 Output:  6300    Net:  -2418    Daily 07:00   Grand Totals Intake:  240 Output:  1375    Net:  -1135 24 Hr.:  -1135   IV (Primary)      In:  240   Urine ml     Out:  1375   Length of Stay Totals Intake:  3882 Output:  6300    Net:  -2418    08:30   Grand Totals Intake:   Output:      Net:   24 Hr.:     Unmeasured Intake  Sips; Few bites   Assessment & Diagnosis: Axis I: Dementia with Behavioral Disturbances.   Axis II: none.   Axis III: see PMH.   Axis IV: Problems with primary support group .   Axis V: 30.  Treatment Plan: Pt will continue on Remeron  po qhs and Ativan prn as prescribed as she is not showing worsening of her symptoms at this time.  Adding meds to control her behavior including atypical antipsychotics or Memory aids will not improve the functionality of the pt at this time unless she is showing signs of worsening symptoms.  She will benefit from placement in SNF and improving the quality of her life.  Collateral info obtained from nursing staff.  Thank you for allowing me to participate in  the care of this pt.  Electronic Signatures: Rhunette Croft (MD)  (Signed 02-Jan-14 15:34)  Authored: Chief Complaint, Presenting Symptoms, History of Present Illness, Target Symptoms, Substance Abuse History, PAST MEDICAL & SURGICAL HX, CURRENT OUTPATIENT MEDICATIONS, Family History, Social History, Mental Status Exam, Suicide Risk Assessment, Review of Systems, NURSING FLOWSHEETS, Assessment & Diagnosis, Treatment Plan   Last Updated: 02-Jan-14 15:34 by Rhunette Croft (MD)

## 2015-04-02 NOTE — Consult Note (Signed)
PATIENT NAME:  Veronica Washington, Veronica M MR#:  [id number] DATE OF BIRTH:  19-Jul-1922  DATE OF CONSULTATION:  12/07/2012  CONSULTING PHYSICIAN:  Dr. Winona LegatoVaickute  REASON FOR CONSULTATION: Elevated troponin with weakness.   CHIEF COMPLAINT: The patient has no complaints and has some dementia.   HISTORY OF PRESENT ILLNESS: This is an 81 year old female with known previous peripheral vascular disease and TIA with hyperlipidemia, hypertension, chronic kidney disease, who came in with weakness and fatigue and shortness of breath. Upon arrival, she has an EKG showing normal sinus rhythm, but no other EKG suggesting acute myocardial infarction. She did have an elevation of troponin of 0.12, at this stage consistent with possible demand ischemia and chronic kidney disease rather than acute myocardial infarction. The patient currently has not had any significant evidence of chest discomfort reported at this time although is mildly short of breath. Chest x-ray shows no evidence of overt heart failure.   REVIEW OF SYSTEMS: Remainder of review of system is difficult due to patient's dementia.   PAST MEDICAL HISTORY:  1.  Hypertension.  2.  Hyperlipidemia.  3.  Chronic kidney disease.  4.  Peripheral vascular disease with previous transient ischemic attack.   FAMILY HISTORY: No family members with early onset of cardiovascular disease or hypertension.   SOCIAL HISTORY: Currently denies alcohol or tobacco use.   ALLERGIES: As listed.   PHYSICAL EXAMINATION:  VITAL SIGNS: Blood pressure 126/62 bilaterally, heart rate 70 upright, reclining, and regular.  GENERAL: She is a well appearing female in no acute distress.  HEAD, EYES, EARS, NOSE, AND THROAT: No icterus, thyromegaly, ulcers, hemorrhage, or xanthelasma.  HEART: Regular rate and rhythm with normal S1, S2, and 2/6 apical murmur consistent with mitral regurgitation. Point of maximal impulse is diffuse. Carotid upstroke normal without bruit. Jugular venous  pressure is normal.  LUNGS: Lungs have few basilar crackles with normal respirations.  ABDOMEN: Soft, nontender, without hepatosplenomegaly or masses. Abdominal aorta is normal size without bruit.  EXTREMITIES: Show 2+ bilateral pulses in dorsal, pedal, radial, and femoral arteries without lower extremity edema, cyanosis, clubbing, or ulcers.  NEUROLOGIC: She is not oriented to time, place, or person due to dementia.   ASSESSMENT: An 81 year old female with peripheral vascular disease, previous transient ischemic attack, hypertension, hyperlipidemia, chronic kidney disease, and an elevated troponin most consistent with demand ischemia and current illness was chronic kidney disease rather than acute myocardial infarction and no current evidence of congestive heart failure or true angina.   RECOMMENDATIONS:  1.  Continue further treatment, weakness, fatigue and failure to thrive following for any other reasons why she would have current issues.  2.  Echocardiogram for left ventricular systolic dysfunction, possible causes of minimal elevation of troponin.  3.  Continue serial ECG and enzymes to assess for possible myocardial infarction.  4.  Begin ambulation and follow for other need in adjustments of medications.  5.  Hydration for chronic kidney disease and weakness and fatigue.   Lamar BlinksBruce J. Kenlee Vogt, MD bjk:th D: 12/08/2012 18:00:00 ET T: 12/08/2012 21:50:55 ET JOB#: [id number]  cc: Lamar BlinksBruce J. Trevel Dillenbeck, MD, <Dictator> Lamar BlinksBRUCE J Sage Hammill MD ELECTRONICALLY SIGNED 12/26/2012 8:26
